# Patient Record
Sex: Male | Born: 1990 | Hispanic: Yes | Marital: Single | Smoking: Never smoker
Health system: Southern US, Community
[De-identification: ages and names within clinical notes are randomized; demographics above are authoritative.]

---

## 2014-09-03 ENCOUNTER — Emergency Department (HOSPITAL_COMMUNITY): Payer: No Typology Code available for payment source

## 2014-09-03 ENCOUNTER — Emergency Department (HOSPITAL_COMMUNITY)
Admission: EM | Admit: 2014-09-03 | Discharge: 2014-09-04 | Disposition: A | Discharge status: Home / Self Care | Payer: No Typology Code available for payment source | Attending: Emergency Medicine | Admitting: Emergency Medicine

## 2014-09-03 ENCOUNTER — Encounter (HOSPITAL_COMMUNITY): Payer: Self-pay | Admitting: *Deleted

## 2014-09-03 DIAGNOSIS — S301XXA Contusion of abdominal wall, initial encounter: Secondary | ICD-10-CM

## 2014-09-03 DIAGNOSIS — Y9389 Activity, other specified: Secondary | ICD-10-CM | POA: Diagnosis not present

## 2014-09-03 DIAGNOSIS — Y92410 Unspecified street and highway as the place of occurrence of the external cause: Secondary | ICD-10-CM | POA: Insufficient documentation

## 2014-09-03 DIAGNOSIS — R197 Diarrhea, unspecified: Secondary | ICD-10-CM | POA: Insufficient documentation

## 2014-09-03 DIAGNOSIS — S20212A Contusion of left front wall of thorax, initial encounter: Secondary | ICD-10-CM | POA: Diagnosis not present

## 2014-09-03 DIAGNOSIS — S299XXA Unspecified injury of thorax, initial encounter: Secondary | ICD-10-CM | POA: Diagnosis present

## 2014-09-03 DIAGNOSIS — Y998 Other external cause status: Secondary | ICD-10-CM | POA: Insufficient documentation

## 2014-09-03 LAB — CBC WITH DIFFERENTIAL/PLATELET
BASOS PCT: 0 % (ref 0–1)
Basophils Absolute: 0 10*3/uL (ref 0.0–0.1)
EOS ABS: 0.1 10*3/uL (ref 0.0–0.7)
EOS PCT: 1 % (ref 0–5)
HCT: 45.1 % (ref 39.0–52.0)
HEMOGLOBIN: 15.8 g/dL (ref 13.0–17.0)
Lymphocytes Relative: 33 % (ref 12–46)
Lymphs Abs: 2.5 10*3/uL (ref 0.7–4.0)
MCH: 30.9 pg (ref 26.0–34.0)
MCHC: 35 g/dL (ref 30.0–36.0)
MCV: 88.3 fL (ref 78.0–100.0)
MONOS PCT: 7 % (ref 3–12)
Monocytes Absolute: 0.5 10*3/uL (ref 0.1–1.0)
NEUTROS PCT: 59 % (ref 43–77)
Neutro Abs: 4.6 10*3/uL (ref 1.7–7.7)
PLATELETS: 198 10*3/uL (ref 150–400)
RBC: 5.11 MIL/uL (ref 4.22–5.81)
RDW: 12.5 % (ref 11.5–15.5)
WBC: 7.7 10*3/uL (ref 4.0–10.5)

## 2014-09-03 LAB — I-STAT CHEM 8, ED
BUN: 20 mg/dL (ref 6–23)
Calcium, Ion: 1.21 mmol/L (ref 1.12–1.23)
Chloride: 102 mEq/L (ref 96–112)
Creatinine, Ser: 1 mg/dL (ref 0.50–1.35)
Glucose, Bld: 108 mg/dL — ABNORMAL HIGH (ref 70–99)
HEMATOCRIT: 49 % (ref 39.0–52.0)
Hemoglobin: 16.7 g/dL (ref 13.0–17.0)
POTASSIUM: 3.6 meq/L — AB (ref 3.7–5.3)
Sodium: 140 mEq/L (ref 137–147)
TCO2: 25 mmol/L (ref 0–100)

## 2014-09-03 MED ORDER — IOHEXOL 300 MG/ML  SOLN
100.0000 mL | Freq: Once | INTRAMUSCULAR | Status: AC | PRN
  Administered 2014-09-03: 80 mL via INTRAVENOUS

## 2014-09-03 MED ORDER — OXYCODONE-ACETAMINOPHEN 5-325 MG PO TABS
2.0000 | ORAL_TABLET | Freq: Once | ORAL | Status: AC
  Administered 2014-09-03: 2 via ORAL
  Filled 2014-09-03 (×2): qty 2

## 2014-09-03 MED ORDER — OXYCODONE-ACETAMINOPHEN 5-325 MG PO TABS: 1.0000 | ORAL_TABLET | Freq: Four times a day (QID) | ORAL | Status: AC | PRN

## 2014-09-03 MED ORDER — OXYCODONE-ACETAMINOPHEN 5-325 MG PO TABS: 1.0000 | ORAL_TABLET | Freq: Four times a day (QID) | ORAL | Status: DC | PRN
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
## 2014-09-04 NOTE — ED Notes (Signed)
Pt a/o x 4 on d/c with family. 

## 2015-01-02 ENCOUNTER — Emergency Department (HOSPITAL_COMMUNITY)
Admission: EM | Admit: 2015-01-02 | Discharge: 2015-01-02 | Disposition: A | Discharge status: Home / Self Care | Payer: Self-pay | Attending: Emergency Medicine | Admitting: Emergency Medicine

## 2015-01-02 ENCOUNTER — Encounter (HOSPITAL_COMMUNITY): Payer: Self-pay | Admitting: *Deleted

## 2015-01-02 DIAGNOSIS — S81801A Unspecified open wound, right lower leg, initial encounter: Secondary | ICD-10-CM | POA: Insufficient documentation

## 2015-01-02 DIAGNOSIS — Y998 Other external cause status: Secondary | ICD-10-CM | POA: Insufficient documentation

## 2015-01-02 DIAGNOSIS — Y9289 Other specified places as the place of occurrence of the external cause: Secondary | ICD-10-CM | POA: Insufficient documentation

## 2015-01-02 DIAGNOSIS — W540XXA Bitten by dog, initial encounter: Secondary | ICD-10-CM | POA: Insufficient documentation

## 2015-01-02 DIAGNOSIS — Y9389 Activity, other specified: Secondary | ICD-10-CM | POA: Insufficient documentation

## 2015-01-02 MED ORDER — TRAMADOL HCL 50 MG PO TABS: 50.0000 mg | ORAL_TABLET | Freq: Four times a day (QID) | ORAL | Status: AC | PRN

## 2015-01-02 MED ORDER — AMOXICILLIN-POT CLAVULANATE 875-125 MG PO TABS: 1.0000 | ORAL_TABLET | Freq: Two times a day (BID) | ORAL | Status: DC

## 2015-01-02 NOTE — ED Notes (Signed)
Declined W/C at D/C and was escorted to lobby by RN. 

## 2015-01-02 NOTE — ED Notes (Signed)
Pt was bitten by neighbor's dog.  The dog did not have updated shots.

## 2015-01-02 NOTE — ED Provider Notes (Signed)
CSN: [id number]     Arrival date & time 01/02/15  1755 History  This chart was scribed for Arthor CaptainAbigail Dickey Caamano, PA-C working with Vanetta MuldersScott Zackowski, MD by Evon Slackerrance Branch, ED Scribe. This patient was seen in room TR02C/TR02C and the patient's care was started at 6:25 PM.      Chief Complaint  Patient presents with  . Animal Bite   The history is provided by the patient. No language interpreter was used.   HPI Comments: Tamera ReasonJorge Top is a 24 y.o. male who presents to the Emergency Department complaining of new sudden dog bite onset today at 4:30 PM. Pt was bitten in the right lower leg. Pt states that his neighbors pit bull bit him today. Pt states that the dogs shots are not UTD. Pt states doesn't report any medications PTA. Pt doesn't report any other symptoms.   History reviewed. No pertinent past medical history. History reviewed. No pertinent past surgical history. No family history on file. History  Substance Use Topics  . Smoking status: Never Smoker   . Smokeless tobacco: Not on file  . Alcohol Use: Yes     Comment: occ    Review of Systems  Skin: Positive for wound.  All other systems reviewed and are negative.    Allergies  Review of patient's allergies indicates no known allergies.  Home Medications   Prior to Admission medications   Medication Sig Start Date End Date Taking? Authorizing Provider  oxyCODONE-acetaminophen (PERCOCET/ROXICET) 5-325 MG per tablet Take 1 tablet by mouth every 6 (six) hours as needed for severe pain. 09/03/14   Earley FavorGail Schulz, NP   BP 131/64 mmHg  Pulse 67  Temp(Src) 97.9 F (36.6 C) (Oral)  Resp 18  Ht 5\' 7"  (1.702 m)  Wt 137 lb (62.143 kg)  BMI 21.45 kg/m2  SpO2 99%   Physical Exam  Constitutional: He is oriented to person, place, and time. He appears well-developed and well-nourished. No distress.  HENT:  Head: Normocephalic and atraumatic.  Eyes: Conjunctivae and EOM are normal.  Neck: Neck supple. No tracheal deviation  present.  Cardiovascular: Normal rate.   Pulmonary/Chest: Effort normal. No respiratory distress.  Musculoskeletal: Normal range of motion.  Neurological: He is alert and oriented to person, place, and time.  Skin: Skin is warm and dry.  4 cm circular dog bite to the right shin.   Psychiatric: He has a normal mood and affect. His behavior is normal.  Nursing note and vitals reviewed.   ED Course  Procedures (including critical care time) DIAGNOSTIC STUDIES: Oxygen Saturation is 99% on RA, normal by my interpretation.    COORDINATION OF CARE: 7:09 PM-Discussed treatment plan with pt at bedside and pt agreed to plan.     Labs Review Labs Reviewed - No data to display  Imaging Review No results found.   EKG Interpretation None      MDM   Final diagnoses:  Dog bite   Patient bitten by the neighbor's dog. Animal control notified. No rabies vax given as animal will likely be taken into custody and observed. The patient will return within 10 days if animal cannot be located. Bite wound is thoroughly cleansed. tdap updated.appears safe for discharge    I personally performed the services described in this documentation, which was scribed in my presence. The recorded information has been reviewed and is accurate.        Arthor CaptainAbigail Steed Kanaan, PA-C 01/06/15 1801  Vanetta MuldersScott Zackowski, MD 01/08/15 [phone]
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
## 2020-07-27 ENCOUNTER — Other Ambulatory Visit: Payer: Self-pay

## 2020-07-27 ENCOUNTER — Emergency Department (HOSPITAL_COMMUNITY)
Admission: EM | Admit: 2020-07-27 | Discharge: 2020-07-27 | Disposition: A | Discharge status: Home / Self Care | Payer: Self-pay | Attending: Emergency Medicine | Admitting: Emergency Medicine

## 2020-07-27 ENCOUNTER — Emergency Department (HOSPITAL_COMMUNITY): Payer: Self-pay

## 2020-07-27 ENCOUNTER — Encounter (HOSPITAL_COMMUNITY): Payer: Self-pay | Admitting: Emergency Medicine

## 2020-07-27 DIAGNOSIS — S21211A Laceration without foreign body of right back wall of thorax without penetration into thoracic cavity, initial encounter: Secondary | ICD-10-CM | POA: Insufficient documentation

## 2020-07-27 DIAGNOSIS — S0101XA Laceration without foreign body of scalp, initial encounter: Secondary | ICD-10-CM | POA: Insufficient documentation

## 2020-07-27 DIAGNOSIS — S199XXA Unspecified injury of neck, initial encounter: Secondary | ICD-10-CM | POA: Insufficient documentation

## 2020-07-27 DIAGNOSIS — S99911A Unspecified injury of right ankle, initial encounter: Secondary | ICD-10-CM | POA: Insufficient documentation

## 2020-07-27 DIAGNOSIS — T148XXA Other injury of unspecified body region, initial encounter: Secondary | ICD-10-CM

## 2020-07-27 DIAGNOSIS — Z23 Encounter for immunization: Secondary | ICD-10-CM | POA: Insufficient documentation

## 2020-07-27 LAB — CBC WITH DIFFERENTIAL/PLATELET
Abs Immature Granulocytes: 0.06 10*3/uL (ref 0.00–0.07)
Basophils Absolute: 0 10*3/uL (ref 0.0–0.1)
Basophils Relative: 0 %
Eosinophils Absolute: 0 10*3/uL (ref 0.0–0.5)
Eosinophils Relative: 0 %
HCT: 41.2 % (ref 39.0–52.0)
Hemoglobin: 14.3 g/dL (ref 13.0–17.0)
Immature Granulocytes: 1 %
Lymphocytes Relative: 13 %
Lymphs Abs: 1.7 10*3/uL (ref 0.7–4.0)
MCH: 31.4 pg (ref 26.0–34.0)
MCHC: 34.7 g/dL (ref 30.0–36.0)
MCV: 90.4 fL (ref 80.0–100.0)
Monocytes Absolute: 1 10*3/uL (ref 0.1–1.0)
Monocytes Relative: 8 %
Neutro Abs: 9.8 10*3/uL — ABNORMAL HIGH (ref 1.7–7.7)
Neutrophils Relative %: 78 %
Platelets: 223 10*3/uL (ref 150–400)
RBC: 4.56 MIL/uL (ref 4.22–5.81)
RDW: 12.7 % (ref 11.5–15.5)
WBC: 12.5 10*3/uL — ABNORMAL HIGH (ref 4.0–10.5)
nRBC: 0 % (ref 0.0–0.2)

## 2020-07-27 LAB — BASIC METABOLIC PANEL
Anion gap: 13 (ref 5–15)
BUN: 13 mg/dL (ref 6–20)
CO2: 22 mmol/L (ref 22–32)
Calcium: 9.5 mg/dL (ref 8.9–10.3)
Chloride: 99 mmol/L (ref 98–111)
Creatinine, Ser: 0.88 mg/dL (ref 0.61–1.24)
GFR, Estimated: >60 mL/min (ref >60)
Glucose, Bld: 100 mg/dL — ABNORMAL HIGH (ref 70–99)
Potassium: 3.7 mmol/L (ref 3.5–5.1)
Sodium: 134 mmol/L — ABNORMAL LOW (ref 135–145)

## 2020-07-27 MED ORDER — TETANUS-DIPHTH-ACELL PERTUSSIS 5-2.5-18.5 LF-MCG/0.5 IM SUSY
0.5000 mL | PREFILLED_SYRINGE | Freq: Once | INTRAMUSCULAR | Status: AC
Start: 2020-07-27 — End: 2020-07-27
  Administered 2020-07-27: 0.5 mL via INTRAMUSCULAR
  Filled 2020-07-27: qty 0.5

## 2020-07-27 MED ORDER — IOHEXOL 300 MG/ML  SOLN
100.0000 mL | Freq: Once | INTRAMUSCULAR | Status: AC | PRN
  Administered 2020-07-27: 100 mL via INTRAVENOUS

## 2020-07-27 MED ORDER — CYCLOBENZAPRINE HCL 10 MG PO TABS
10.0000 mg | ORAL_TABLET | Freq: Once | ORAL | Status: AC
  Administered 2020-07-27: 10 mg via ORAL
  Filled 2020-07-27: qty 1

## 2020-07-27 MED ORDER — ACETAMINOPHEN 325 MG PO TABS
650.0000 mg | ORAL_TABLET | Freq: Once | ORAL | Status: AC
  Administered 2020-07-27: 650 mg via ORAL
  Filled 2020-07-27: qty 2

## 2020-07-27 MED ORDER — LIDOCAINE-EPINEPHRINE 1 %-1:100000 IJ SOLN
10.0000 mL | Freq: Once | INTRAMUSCULAR | Status: AC
  Administered 2020-07-27: 10 mL
  Filled 2020-07-27: qty 1

## 2020-07-27 MED ORDER — CYCLOBENZAPRINE HCL 10 MG PO TABS: 10.0000 mg | ORAL_TABLET | Freq: Three times a day (TID) | ORAL | 0 refills | Status: AC | PRN

## 2020-07-27 MED ORDER — CEFAZOLIN SODIUM-DEXTROSE 1-4 GM/50ML-% IV SOLN
1.0000 g | Freq: Once | INTRAVENOUS | Status: AC
  Administered 2020-07-27: 1 g via INTRAVENOUS
  Filled 2020-07-27: qty 50

## 2020-07-27 MED ORDER — AMOXICILLIN-POT CLAVULANATE 875-125 MG PO TABS: 1.0000 | ORAL_TABLET | Freq: Two times a day (BID) | ORAL | 0 refills | Status: AC

## 2020-07-27 NOTE — ED Notes (Signed)
Pt transported to Xray at this time.

## 2020-07-27 NOTE — Discharge Instructions (Signed)
Please have your staples removed in about 10 days.

## 2020-07-27 NOTE — ED Notes (Signed)
Pt back from CT

## 2020-07-27 NOTE — ED Notes (Signed)
Pt transported to CT at this time.
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
## 2020-07-27 NOTE — ED Notes (Signed)
Pt back from X-ray.  

## 2020-07-27 NOTE — ED Notes (Signed)
Reviewed discharge instructions with patient and significant other. Follow-up care and medications reviewed. Patient and significant other verbalized understanding. Patient A&Ox4, VSS upon discharge. 
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
## 2020-08-07 ENCOUNTER — Encounter (HOSPITAL_COMMUNITY): Payer: Self-pay

## 2020-08-07 ENCOUNTER — Emergency Department (HOSPITAL_COMMUNITY)
Admission: EM | Admit: 2020-08-07 | Discharge: 2020-08-07 | Disposition: A | Discharge status: Home / Self Care | Payer: Self-pay | Attending: Emergency Medicine | Admitting: Emergency Medicine

## 2020-08-07 ENCOUNTER — Other Ambulatory Visit: Payer: Self-pay

## 2020-08-07 DIAGNOSIS — Z4802 Encounter for removal of sutures: Secondary | ICD-10-CM | POA: Insufficient documentation

## 2020-08-07 NOTE — ED Triage Notes (Signed)
Pt reports 5 sutures in head and one on back. Here for removal.

## 2020-08-07 NOTE — ED Provider Notes (Signed)
[facility]-EMERGENCY DEPT Provider Note   CSN: [id number] Arrival date & time: 08/07/20  2058     History Chief Complaint  Patient presents with  . Suture / Staple Removal    Frank Campbell is a 29 y.o. male.  29 year old male presents for staple removal. Staples placed 11 days ago. Td UTD, no complaints.         History reviewed. No pertinent past medical history.  There are no problems to display for this patient.   History reviewed. No pertinent surgical history.     No family history on file.  Social History   Tobacco Use  . Smoking status: Never Smoker  . Smokeless tobacco: Never Used  Substance Use Topics  . Alcohol use: Yes    Comment: occ  . Drug use: Not Currently    Home Medications Prior to Admission medications   Medication Sig Start Date End Date Taking? Authorizing Provider  oxyCODONE-acetaminophen (PERCOCET/ROXICET) 5-325 MG per tablet Take 1 tablet by mouth every 6 (six) hours as needed for severe pain. 09/03/14   Earley Favor, NP  traMADol (ULTRAM) 50 MG tablet Take 1 tablet (50 mg total) by mouth every 6 (six) hours as needed. 01/02/15   Arthor Captain, PA-C    Allergies    Patient has no known allergies.  Review of Systems   Review of Systems  Constitutional: Negative for fever.  Skin: Positive for wound.  Allergic/Immunologic: Negative for immunocompromised state.    Physical Exam Updated Vital Signs BP (!) 152/104 (BP Location: Right Arm)   Pulse (!) 125   Temp 98.2 F (36.8 C) (Oral)   Resp 18   Ht 5\' 7"  (1.702 m)   Wt 62.1 kg   SpO2 100%   BMI 21.46 kg/m   Physical Exam Vitals and nursing note reviewed.  Constitutional:      General: He is not in acute distress.    Appearance: He is well-developed. He is not diaphoretic.  HENT:     Head: Normocephalic and atraumatic.  Pulmonary:     Effort: Pulmonary effort is normal.  Skin:    Comments: 1 staple in place and back, 5 staples in  place in posterior scalp.  No signs of infection, wounds appear to be healing well.  Neurological:     Mental Status: He is alert and oriented to person, place, and time.  Psychiatric:        Behavior: Behavior normal.     ED Results / Procedures / Treatments   Labs (all labs ordered are listed, but only abnormal results are displayed) Labs Reviewed - No data to display  EKG None  Radiology No results found.  Procedures .Suture Removal  Date/Time: 08/07/2020 9:52 PM Performed by: 08/09/2020, PA-C Authorized by: Jeannie Fend, PA-C   Consent:    Consent obtained:  Verbal   Consent given by:  Patient   Risks discussed:  Bleeding, pain and wound separation   Alternatives discussed:  No treatment Location:    Location:  Head/neck   Head/neck location:  Scalp Procedure details:    Wound appearance:  No signs of infection   Number of staples removed:  5 Post-procedure details:    Post-removal:  No dressing applied   Patient tolerance of procedure:  Tolerated well, no immediate complications .Suture Removal  Date/Time: 08/07/2020 9:52 PM Performed by: 08/09/2020, PA-C Authorized by: Jeannie Fend, PA-C   Consent:    Consent obtained:  Verbal   Consent given by:  Patient   Risks discussed:  Bleeding   Alternatives discussed:  No treatment Location:    Location:  Trunk   Trunk location:  Back Procedure details:    Wound appearance:  No signs of infection   Number of staples removed:  1 Post-procedure details:    Post-removal:  No dressing applied   Patient tolerance of procedure:  Tolerated well, no immediate complications   (including critical care time)  Medications Ordered in ED Medications - No data to display  ED Course  I have reviewed the triage vital signs and the nursing notes.  Pertinent labs & imaging results that were available during my care of the patient were reviewed by me and considered in my medical decision making (see chart  for details).    MDM Rules/Calculators/A&P                          Final Clinical Impression(s) / ED Diagnoses Final diagnoses:  Encounter for staple removal    Rx / DC Orders ED Discharge Orders    None       Alden Hipp 08/07/20 2152    Pollyann Savoy, MD 08/07/20 2209

## 2020-08-07 NOTE — ED Notes (Signed)
Patient left without discharge instructions or discharge vital signs. PA notified
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
